# Patient Record
Sex: Male | Born: 1980 | Hispanic: Yes | Marital: Single | Smoking: Former smoker
Health system: Southern US, Community
[De-identification: ages and names within clinical notes are randomized; demographics above are authoritative.]

---

## 2019-11-05 ENCOUNTER — Other Ambulatory Visit: Payer: Self-pay

## 2019-11-07 ENCOUNTER — Emergency Department (HOSPITAL_COMMUNITY): Payer: HRSA Program

## 2019-11-07 ENCOUNTER — Inpatient Hospital Stay (HOSPITAL_COMMUNITY)
Admission: EM | Admit: 2019-11-07 | Discharge: 2019-11-09 | DRG: 177 | Disposition: A | Discharge status: Home / Self Care | Payer: HRSA Program | Attending: Internal Medicine | Admitting: Internal Medicine

## 2019-11-07 ENCOUNTER — Other Ambulatory Visit: Payer: Self-pay

## 2019-11-07 ENCOUNTER — Encounter (HOSPITAL_COMMUNITY): Payer: Self-pay | Admitting: *Deleted

## 2019-11-07 DIAGNOSIS — N179 Acute kidney failure, unspecified: Secondary | ICD-10-CM | POA: Diagnosis present

## 2019-11-07 DIAGNOSIS — J9601 Acute respiratory failure with hypoxia: Secondary | ICD-10-CM | POA: Diagnosis present

## 2019-11-07 DIAGNOSIS — U071 COVID-19: Principal | ICD-10-CM | POA: Diagnosis present

## 2019-11-07 DIAGNOSIS — I10 Essential (primary) hypertension: Secondary | ICD-10-CM | POA: Diagnosis present

## 2019-11-07 DIAGNOSIS — J1282 Pneumonia due to coronavirus disease 2019: Secondary | ICD-10-CM | POA: Diagnosis present

## 2019-11-07 DIAGNOSIS — R739 Hyperglycemia, unspecified: Secondary | ICD-10-CM | POA: Diagnosis present

## 2019-11-07 DIAGNOSIS — R05 Cough: Secondary | ICD-10-CM | POA: Diagnosis present

## 2019-11-07 LAB — CBC WITH DIFFERENTIAL/PLATELET
Abs Immature Granulocytes: 0.01 10*3/uL (ref 0.00–0.07)
Basophils Absolute: 0 10*3/uL (ref 0.0–0.1)
Basophils Relative: 1 %
Eosinophils Absolute: 0 10*3/uL (ref 0.0–0.5)
Eosinophils Relative: 0 %
HCT: 41.7 % (ref 39.0–52.0)
Hemoglobin: 14.2 g/dL (ref 13.0–17.0)
Immature Granulocytes: 0 %
Lymphocytes Relative: 21 %
Lymphs Abs: 0.7 10*3/uL (ref 0.7–4.0)
MCH: 30.5 pg (ref 26.0–34.0)
MCHC: 34.1 g/dL (ref 30.0–36.0)
MCV: 89.7 fL (ref 80.0–100.0)
Monocytes Absolute: 0.2 10*3/uL (ref 0.1–1.0)
Monocytes Relative: 7 %
Neutro Abs: 2.3 10*3/uL (ref 1.7–7.7)
Neutrophils Relative %: 71 %
Platelets: 157 10*3/uL (ref 150–400)
RBC: 4.65 MIL/uL (ref 4.22–5.81)
RDW: 13.1 % (ref 11.5–15.5)
WBC: 3.2 10*3/uL — ABNORMAL LOW (ref 4.0–10.5)
nRBC: 0 % (ref 0.0–0.2)

## 2019-11-07 LAB — FERRITIN: Ferritin: 2921 ng/mL — ABNORMAL HIGH (ref 24–336)

## 2019-11-07 LAB — COMPREHENSIVE METABOLIC PANEL
ALT: 75 U/L — ABNORMAL HIGH (ref 0–44)
AST: 103 U/L — ABNORMAL HIGH (ref 15–41)
Albumin: 3.5 g/dL (ref 3.5–5.0)
Alkaline Phosphatase: 40 U/L (ref 38–126)
Anion gap: 13 (ref 5–15)
BUN: 13 mg/dL (ref 6–20)
CO2: 22 mmol/L (ref 22–32)
Calcium: 9.1 mg/dL (ref 8.9–10.3)
Chloride: 101 mmol/L (ref 98–111)
Creatinine, Ser: 1.47 mg/dL — ABNORMAL HIGH (ref 0.61–1.24)
GFR calc Af Amer: >60 mL/min (ref >60)
GFR calc non Af Amer: 59 mL/min — ABNORMAL LOW (ref >60)
Glucose, Bld: 122 mg/dL — ABNORMAL HIGH (ref 70–99)
Potassium: 3.5 mmol/L (ref 3.5–5.1)
Sodium: 136 mmol/L (ref 135–145)
Total Bilirubin: 0.8 mg/dL (ref 0.3–1.2)
Total Protein: 7.3 g/dL (ref 6.5–8.1)

## 2019-11-07 LAB — C-REACTIVE PROTEIN: CRP: 6.3 mg/dL — ABNORMAL HIGH (ref <1.0)

## 2019-11-07 LAB — TRIGLYCERIDES: Triglycerides: 217 mg/dL — ABNORMAL HIGH (ref <150)

## 2019-11-07 LAB — HIV ANTIBODY (ROUTINE TESTING W REFLEX): HIV Screen 4th Generation wRfx: NONREACTIVE

## 2019-11-07 LAB — FIBRINOGEN: Fibrinogen: 792 mg/dL — ABNORMAL HIGH (ref 210–475)

## 2019-11-07 LAB — TROPONIN I (HIGH SENSITIVITY)
Troponin I (High Sensitivity): 10 ng/L (ref <18)
Troponin I (High Sensitivity): 7 ng/L (ref <18)

## 2019-11-07 LAB — LACTIC ACID, PLASMA
Lactic Acid, Venous: 1.1 mmol/L (ref 0.5–1.9)
Lactic Acid, Venous: 1.1 mmol/L (ref 0.5–1.9)

## 2019-11-07 LAB — SARS CORONAVIRUS 2 BY RT PCR (HOSPITAL ORDER, PERFORMED IN ~~LOC~~ HOSPITAL LAB): SARS Coronavirus 2: POSITIVE — AB

## 2019-11-07 LAB — D-DIMER, QUANTITATIVE: D-Dimer, Quant: 2.01 ug/mL-FEU — ABNORMAL HIGH (ref 0.00–0.50)

## 2019-11-07 LAB — LACTATE DEHYDROGENASE: LDH: 303 U/L — ABNORMAL HIGH (ref 98–192)

## 2019-11-07 LAB — PROCALCITONIN: Procalcitonin: <0.1 ng/mL

## 2019-11-07 MED ORDER — DEXAMETHASONE SODIUM PHOSPHATE 10 MG/ML IJ SOLN
10.0000 mg | INTRAMUSCULAR | Status: DC
  Administered 2019-11-08 – 2019-11-09 (×2): 10 mg via INTRAVENOUS
  Filled 2019-11-07 (×2): qty 1

## 2019-11-07 MED ORDER — ACETAMINOPHEN 325 MG PO TABS
650.0000 mg | ORAL_TABLET | Freq: Once | ORAL | Status: AC | PRN
  Administered 2019-11-07: 650 mg via ORAL
  Filled 2019-11-07: qty 2

## 2019-11-07 MED ORDER — SODIUM CHLORIDE 0.9 % IV SOLN
200.0000 mg | Freq: Once | INTRAVENOUS | Status: AC
  Administered 2019-11-07: 200 mg via INTRAVENOUS
  Filled 2019-11-07: qty 40

## 2019-11-07 MED ORDER — ACETAMINOPHEN 650 MG RE SUPP: 650.0000 mg | Freq: Four times a day (QID) | RECTAL | Status: DC | PRN

## 2019-11-07 MED ORDER — DEXAMETHASONE SODIUM PHOSPHATE 10 MG/ML IJ SOLN
10.0000 mg | Freq: Once | INTRAMUSCULAR | Status: AC
  Administered 2019-11-07: 10 mg via INTRAVENOUS
  Filled 2019-11-07: qty 1

## 2019-11-07 MED ORDER — SODIUM CHLORIDE 0.9 % IV BOLUS
1000.0000 mL | Freq: Once | INTRAVENOUS | Status: AC
  Administered 2019-11-07: 1000 mL via INTRAVENOUS

## 2019-11-07 MED ORDER — SODIUM CHLORIDE 0.9 % IV SOLN
100.0000 mg | Freq: Every day | INTRAVENOUS | Status: DC
  Administered 2019-11-08 – 2019-11-09 (×2): 100 mg via INTRAVENOUS
  Filled 2019-11-07 (×3): qty 20

## 2019-11-07 MED ORDER — HEPARIN SODIUM (PORCINE) 5000 UNIT/ML IJ SOLN
5000.0000 [IU] | Freq: Three times a day (TID) | INTRAMUSCULAR | Status: DC
  Administered 2019-11-07 – 2019-11-08 (×3): 5000 [IU] via SUBCUTANEOUS
  Filled 2019-11-07 (×4): qty 1

## 2019-11-07 MED ORDER — ACETAMINOPHEN 325 MG PO TABS: 650.0000 mg | ORAL_TABLET | Freq: Four times a day (QID) | ORAL | Status: DC | PRN

## 2019-11-07 NOTE — ED Notes (Signed)
Patient is resting comfortably. 

## 2019-11-07 NOTE — Hospital Course (Addendum)
Admitted 11/07/2019  Allergies: Patient has no known allergies. Pertinent Hx: HTN  39 y.o. male p/w worsening SOB, dry cough, and CP  *AHRF 2/2 COVID-19: Weakness, fatigue, sweating x10 days. CP, SOB, and dry cough x 1 day. Diagnosed with COVID a few days ago. CXR and inflammatory markers consistent. On Remdesivir and decadron. On 2L [place].   *AKI: Cr 1.47, unclear BL, given fluids and repeating in AM  *Transaminitis: Unclear baseline, likely 2/2 COVID. Trending after fluids.   Consults: None  Meds: Remdesivir, decadron VTE ppx: Heparin IVF: NS Diet: HH

## 2019-11-07 NOTE — ED Provider Notes (Signed)
[facility] EMERGENCY DEPARTMENT Provider Note   CSN: [id number] Arrival date & time: 11/07/19  0703     History Chief Complaint  Patient presents with  . Cough    Dylan Tucker is a 39 y.o. male.  HPI 39 year old male presents with shortness of breath and low oxygen level.  History is taken with the help of the Spanish interpreter line.  He was diagnosed with Covid a few days ago.  He has had weakness and shortness of breath but everything got worse yesterday evening.  Some cough.  He does not think he is still having fever though he was febrile in triage.  No vaccination versus Covid. Some chest pain.  History reviewed. No pertinent past medical history.  There are no problems to display for this patient.   History reviewed. No pertinent surgical history.     No family history on file.  Social History   Tobacco Use  . Smoking status: Not on file  Substance Use Topics  . Alcohol use: Not on file  . Drug use: Not on file    Home Medications Prior to Admission medications   Not on File    Allergies    Patient has no known allergies.  Review of Systems   Review of Systems  Constitutional: Positive for fever.  Respiratory: Positive for cough and shortness of breath.   Cardiovascular: Positive for chest pain.  Gastrointestinal: Negative for vomiting.  Neurological: Positive for weakness.  All other systems reviewed and are negative.   Physical Exam Updated Vital Signs BP 111/68 (BP Location: Right Arm)   Pulse (!) 101   Temp (!) 103.1 F (39.5 C) (Oral)   Resp (!) 24   SpO2 90%   Physical Exam Vitals and nursing note reviewed.  Constitutional:      Appearance: He is well-developed. He is diaphoretic.  HENT:     Head: Normocephalic and atraumatic.     Right Ear: External ear normal.     Left Ear: External ear normal.     Nose: Nose normal.  Eyes:     General:        Right eye: No discharge.        Left eye: No discharge.    Cardiovascular:     Rate and Rhythm: Regular rhythm. Tachycardia present.     Heart sounds: Normal heart sounds.  Pulmonary:     Effort: Pulmonary effort is normal. No accessory muscle usage.     Breath sounds: Examination of the right-lower field reveals rales. Examination of the left-lower field reveals rales. Rales present.  Abdominal:     Palpations: Abdomen is soft.     Tenderness: There is no abdominal tenderness.  Musculoskeletal:     Cervical back: Neck supple.  Skin:    General: Skin is warm.  Neurological:     Mental Status: He is alert.  Psychiatric:        Mood and Affect: Mood is not anxious.     ED Results / Procedures / Treatments   Labs (all labs ordered are listed, but only abnormal results are displayed) Labs Reviewed  CBC WITH DIFFERENTIAL/PLATELET - Abnormal; Notable for the following components:      Result Value   WBC 3.2 (*)    All other components within normal limits  SARS CORONAVIRUS 2 BY RT PCR (HOSPITAL ORDER, PERFORMED IN [facility])  CULTURE, BLOOD (ROUTINE X 2)  CULTURE, BLOOD (ROUTINE X 2)  LACTIC ACID, PLASMA  LACTIC ACID, PLASMA  COMPREHENSIVE METABOLIC PANEL  D-DIMER, QUANTITATIVE (NOT AT [facility])  PROCALCITONIN  LACTATE DEHYDROGENASE  FERRITIN  FIBRINOGEN  C-REACTIVE PROTEIN  TRIGLYCERIDES  TROPONIN I (HIGH SENSITIVITY)    EKG EKG Interpretation  Date/Time:  Friday November 07 2019 08:58:31 EDT Ventricular Rate:  93 PR Interval:    QRS Duration: 106 QT Interval:  348 QTC Calculation: 433 R Axis:   -31 Text Interpretation: Sinus rhythm Left axis deviation Low voltage, precordial leads Abnormal R-wave progression, early transition Baseline wander in lead(s) V4 No old tracing to compare Confirmed by Pricilla Loveless [phone]) on 11/07/2019 9:16:59 AM   Radiology DG Chest Portable 1 View  Result Date: 11/07/2019 CLINICAL DATA:  COVID positive. EXAM: PORTABLE CHEST 1 VIEW COMPARISON:  None. FINDINGS: 0737 hours.  Markedly low lung volumes. Cardiopericardial silhouette is at upper limits of normal for size. Patchy mid and lower lung multifocal ground-glass airspace disease noted bilaterally. No pleural effusion. No overt edema. The visualized bony structures of the thorax show no acute abnormality. IMPRESSION: Patchy bilateral multifocal ground-glass airspace disease compatible with multifocal pneumonia. Electronically Signed   By: Kennith Center M.D.   On: 11/07/2019 07:45    Procedures Procedures (including critical care time)  Medications Ordered in ED Medications  remdesivir 200 mg in sodium chloride 0.9% 250 mL IVPB (has no administration in time range)    Followed by  remdesivir 100 mg in sodium chloride 0.9 % 100 mL IVPB (has no administration in time range)  acetaminophen (TYLENOL) tablet 650 mg (650 mg Oral Given 11/07/19 0837)  dexamethasone (DECADRON) injection 10 mg (10 mg Intravenous Given 11/07/19 0847)    ED Course  I have reviewed the triage vital signs and the nursing notes.  Pertinent labs & imaging results that were available during my care of the patient were reviewed by me and considered in my medical decision making (see chart for details).    MDM Rules/Calculators/A&P                          Patient became hypoxic to 85% when he stood up to change into a gown only.  Given this, he will need to be admitted with supportive care and other treatments for Covid such as steroids and remdesivir.  He was placed on 2 L oxygen.  His chest x-ray, personally reviewed by myself, shows bilateral pneumonia. Labs show leukopenia and mild LFT abnormalities. Admit.  Dylan Tucker was evaluated in Emergency Department on 11/07/2019 for the symptoms described in the history of present illness. He was evaluated in the context of the global COVID-19 pandemic, which necessitated consideration that the patient might be at risk for infection with the SARS-CoV-2 virus that causes COVID-19. Institutional  protocols and algorithms that pertain to the evaluation of patients at risk for COVID-19 are in a state of rapid change based on information released by regulatory bodies including the CDC and federal and state organizations. These policies and algorithms were followed during the patient's care in the ED.  Final Clinical Impression(s) / ED Diagnoses Final diagnoses:  Acute respiratory failure with hypoxia (HCC)  Pneumonia due to COVID-19 virus    Rx / DC Orders ED Discharge Orders    None       Pricilla Loveless, MD 11/07/19 1009

## 2019-11-07 NOTE — ED Notes (Signed)
Kenney Houseman, niece, [phone] would like an update when available

## 2019-11-07 NOTE — H&P (Signed)
Date: 11/07/2019               Patient Name:  Dylan Tucker MRN: [id number]  DOB: [birth year] Age / Sex: 39 y.o., male   PCP: [facility], [place] Service: Internal Medicine Teaching Service         Attending Physician: Dr. Rebeca Alert, Raynaldo Opitz, MD    First Contact: Dr. Sharon Seller Pager: [phone]            After Hours (After 5p/  First Contact Pager: [phone]  weekends / holidays): Second Contact Pager: [phone]   Chief Complaint: SOB, cough, chest pain, weakness  History of Present Illness: This is a 39 year old [place] speaking male with a history of HTN presenting with worsening SOB, weakness, chills, cough, and chest pain.  He reports that about 10 days ago he started having symptoms, with generalized fatigue, sweating, subjective fevers, and feeling generally unwell.  He was diagnosed with COVID and a few days ago at [facility] clinic. He reports that yesterday he started having a nonproductive cough, shortness of breath, and some chest discomfort when he coughed.  The chest pain is located in the substernal area, was a tight sensation, no associated symptoms including worsening shortness of breath, lightheadedness, dizziness, arm pain, or nausea or vomiting.  He states that he has been eating and drinking with no issues.  Denies any urination issues. Denies abdominal pain, nausea, vomiting, headaches, lightheadedness, dizziness, or fatigue.  Denies any current chest pain.  He reports his wife and 2 children have similar symptoms.  He did not get the vaccine, no specific reason why.  In the ED he is noted to be febrile to 103.1, tachypneic to 24, and tachycardic to 128, blood pressure stable 111/68.  Oxygenating well initially on room air however desaturated to 85% when standing and placed on 2 L nasal cannula.  Labs significant for WBC 3.2 on CBC.  CMP showed creatinine 1.47, unclear baseline, AST 103, ALT 75, T bili and alk phos WNL.  D-dimer 2.01.  Pro-Cal <0.1.   LDH 303.  Ferritin 2.9.  Fibrinogen 792.  CRP 6.3.  Triglycerides 217.  Troponins 10.  And Covid 19+.  Chest x-ray showed patchy bilateral multifocal groundglass opacities, consistent with multifocal pneumonia. Admitted to IMTS for further management.  Meds:  Patient on an antihypertensive medication, does not know the name  Allergies: Allergies as of 11/07/2019  . (No Known Allergies)   History reviewed. No pertinent past medical history.  Family History:  No significant family history.   Social History: Denies smoking, occasional Etoh use 6-7 beers about 8 days. Last drink was awhile. Denies recreational drug use. Works in [employer]. Lives with wife and 2 children. Did not get vaccine.   Review of Systems: A complete ROS was negative except as per HPI.   Physical Exam: Blood pressure 114/75, pulse 85, temperature 99.4 F (37.4 C), temperature source Oral, resp. rate (!) 31, SpO2 90 %. Physical Exam Constitutional:      Appearance: Normal appearance.  HENT:     Head: Normocephalic and atraumatic.     Mouth/Throat:     Mouth: Mucous membranes are moist.     Pharynx: Oropharynx is clear.  Eyes:     Extraocular Movements: Extraocular movements intact.     Pupils: Pupils are equal, round, and reactive to light.  Cardiovascular:     Rate and Rhythm: Normal rate and regular rhythm.  Pulmonary:     Effort: Pulmonary effort is normal. No respiratory distress.     Breath sounds: Rales (Dry crackles in lower lung fields) present. No wheezing or rhonchi.  Abdominal:     General: Abdomen is flat. Bowel sounds are normal.     Palpations: Abdomen is soft.     Tenderness: There is no abdominal tenderness. There is no guarding or rebound.  Musculoskeletal:        General: No swelling. Normal range of motion.     Cervical back: Normal range of motion and neck supple.  Skin:    General: Skin is warm and dry.     Capillary Refill: Capillary refill takes less than 2 seconds.    Neurological:     General: No focal deficit present.     Mental Status: He is alert and oriented to person, place, and time.  Psychiatric:        Mood and Affect: Mood normal.        Behavior: Behavior normal.      EKG: personally reviewed my interpretation is normal sinus rhythm, heart rate 93, no axis deviation, no acute ST changes  CXR: personally reviewed my interpretation is diffuse multiple patchy opacities in bilateral lung fields, concerning for multifocal pneumonia  Assessment & Plan by Problem: Active Problems:   COVID-19  This is a 39 year old male with a history of hypertension who is currently unvaccinated presenting with a 10-day history of generalized weakness, subjective fevers, and sweating, and a 1-2-day history of shortness of breath, chest discomfort, and a nonproductive cough.  Recent diagnosis of Covid 19.  Acute hypoxic respiratory failure secondary to COVID-19 pneumonia Patient presenting with 10-day history of generalized weakness, subjective fevers, and sweating, and a 1-2-day history of shortness of breath, chest discomfort, and a nonproductive cough.  Recent diagnosis of Covid 19.  Noted to be febrile, tachypneic, and tachycardic.  Chest x-ray showed diffuse bilateral patchy opacities concerning for multifocal pneumonia.  Labs significant for AKI, leukopenia, and transaminitis.  Inflammatory markers including D-dimer, LDH, ferritin, fibrinogen, and CRP all elevated.  Consistent with acute hypoxic respiratory failure secondary to Covid pneumonia.  Started on IV Decadron and remdesivir.  -Continue remdesivir and Decadron -Trend inflammatory markers -Continue supplemental oxygen as needed, wean as tolerated  AKI: Creatinine 1.47, unclear baseline.  However if this is elevated from his baseline likely secondary to his current infection, he is tachycardic on exam.  Will repeat kidney function in a.m. -1L [place] -CMP in a.m.  Transaminitis: AST 103, ALT 75, T bili  0.8, alk phos 40.  Unclear baseline.  Unclear if this is related to his current COVID-19 infection.  Will repeat CMP in a.m. -CMP in a.m.  Hypertension: Patient is currently on an antihypertensive at home, he is not sure what it is called.  Blood pressure today is well controlled at 111/68.  We will hold off on resuming his blood pressure medications. -Monitor Bps -Patient will obtain home blood pressure medication name  Dispo: Admit patient to Inpatient with expected length of stay greater than 2 midnights.  Signed: Asencion Noble, MD 11/07/2019, 11:56 AM  Pager: [phone] After 5pm on weekdays and 1pm on weekends: On Call pager: [phone]

## 2019-11-07 NOTE — ED Triage Notes (Signed)
Interpretor [id number] used. Pt states he was tested positive for covid 5 days ago at Clinic on [address] . States he is using a pulse ox at home and notes oxygen to be 'low' at 85%. Pulse ox 91% in triage. Pt is alert and oriented. Appears in nad. Given meds but unsure what they were. Pt is unvaccinated for covid.

## 2019-11-08 ENCOUNTER — Encounter (HOSPITAL_COMMUNITY): Payer: Self-pay | Admitting: Internal Medicine

## 2019-11-08 DIAGNOSIS — J9601 Acute respiratory failure with hypoxia: Secondary | ICD-10-CM

## 2019-11-08 DIAGNOSIS — J1282 Pneumonia due to coronavirus disease 2019: Secondary | ICD-10-CM

## 2019-11-08 DIAGNOSIS — U071 COVID-19: Principal | ICD-10-CM

## 2019-11-08 LAB — LACTATE DEHYDROGENASE: LDH: 245 U/L — ABNORMAL HIGH (ref 98–192)

## 2019-11-08 LAB — CBC
HCT: 40.4 % (ref 39.0–52.0)
Hemoglobin: 13.6 g/dL (ref 13.0–17.0)
MCH: 30.6 pg (ref 26.0–34.0)
MCHC: 33.7 g/dL (ref 30.0–36.0)
MCV: 91 fL (ref 80.0–100.0)
Platelets: 178 10*3/uL (ref 150–400)
RBC: 4.44 MIL/uL (ref 4.22–5.81)
RDW: 13.1 % (ref 11.5–15.5)
WBC: 4.2 10*3/uL (ref 4.0–10.5)
nRBC: 0 % (ref 0.0–0.2)

## 2019-11-08 LAB — COMPREHENSIVE METABOLIC PANEL
ALT: 83 U/L — ABNORMAL HIGH (ref 0–44)
AST: 86 U/L — ABNORMAL HIGH (ref 15–41)
Albumin: 3.1 g/dL — ABNORMAL LOW (ref 3.5–5.0)
Alkaline Phosphatase: 40 U/L (ref 38–126)
Anion gap: 10 (ref 5–15)
BUN: 16 mg/dL (ref 6–20)
CO2: 21 mmol/L — ABNORMAL LOW (ref 22–32)
Calcium: 8.9 mg/dL (ref 8.9–10.3)
Chloride: 106 mmol/L (ref 98–111)
Creatinine, Ser: 0.9 mg/dL (ref 0.61–1.24)
GFR calc Af Amer: >60 mL/min (ref >60)
GFR calc non Af Amer: >60 mL/min (ref >60)
Glucose, Bld: 141 mg/dL — ABNORMAL HIGH (ref 70–99)
Potassium: 3.7 mmol/L (ref 3.5–5.1)
Sodium: 137 mmol/L (ref 135–145)
Total Bilirubin: 0.7 mg/dL (ref 0.3–1.2)
Total Protein: 6.9 g/dL (ref 6.5–8.1)

## 2019-11-08 LAB — C-REACTIVE PROTEIN: CRP: 7.8 mg/dL — ABNORMAL HIGH (ref <1.0)

## 2019-11-08 LAB — FERRITIN: Ferritin: 2412 ng/mL — ABNORMAL HIGH (ref 24–336)

## 2019-11-08 MED ORDER — GUAIFENESIN-DM 100-10 MG/5ML PO SYRP: 5.0000 mL | ORAL_SOLUTION | ORAL | Status: DC | PRN

## 2019-11-08 MED ORDER — ENOXAPARIN SODIUM 40 MG/0.4ML ~~LOC~~ SOLN
40.0000 mg | SUBCUTANEOUS | Status: DC
  Administered 2019-11-08 – 2019-11-09 (×2): 40 mg via SUBCUTANEOUS
  Filled 2019-11-08 (×2): qty 0.4

## 2019-11-08 NOTE — ED Notes (Signed)
pts sats 88 or 89  Nasal 02 increased to 3 liters nasal 02

## 2019-11-08 NOTE — ED Notes (Signed)
Tele  Breakfast Ordered 

## 2019-11-08 NOTE — Progress Notes (Signed)
° °  Subjective:   He is feeling much better today and feels that his breathing has improved. Denies shortness of breath or cough, difficulty with urination, dizziness, or chest pain.   Objective:  Vital signs in last 24 hours: Vitals:   11/08/19 0415 11/08/19 0445 11/08/19 0515 11/08/19 0530  BP: 110/74 109/77 106/75 105/71  Pulse: 78 75 77 73  Resp: (!) 27 (!) 37 17 (!) 33  Temp:      TempSrc:      SpO2: (!) 88% 91% 90% 91%    Supplemental Oxygen: 95% on 3L  Constitution: NAD, appears stated age Cardio: RRR, no m/r/g, no LE edema  Respiratory: decreased breath sounds, no w/r/r Abdominal: NTTP, soft, non-distended MSK: moving all extremities Neuro: normal affect, a&ox3, pleasant  Skin: c/d/i   CXR 11/07/2019 Decreased lung volumes, right atelectasis, bilaterally patchy opacities   Assessment/Plan:  Principal Problem:   Pneumonia due to COVID-19 virus Active Problems:   AKI (acute kidney injury) [facility])  39yo male with PMH HTN recently diagnosed with covid-19 after symptoms started around 10 days ago, presented with worsening respiratory failure and supplemental O2 requirement.   COVID-19 Hospital day 2, admitted yestereday requiring 2L on standing and elevated inflammatory labs, currently requiring about 4L [place], CRP slightly increased today 6.3 > 7.8. He was started on remdesivir and decadron. LFTs trending down, transaminitis secondary to covid-19 infection, improving.   - robitussen q4h prn - remdesivir day 2/5 - decadron day 2 - trend inflammatory labs - IS, flutter valve - will need covid vaccine after discharge  AKI AKI resolved with fluids yesterday.   Hypertension Normotensive  VTE: switch to lovenox IVF: none Diet: regular Code: full   Dispo: Anticipated discharge in approximately 1-2 days.   Franshesca Chipman A, DO 11/08/2019, 7:43 AM Pager: [phone] After 5pm on weekdays and 1pm on weekends: On Call Pager: [phone]

## 2019-11-09 LAB — COMPREHENSIVE METABOLIC PANEL
ALT: 79 U/L — ABNORMAL HIGH (ref 0–44)
AST: 56 U/L — ABNORMAL HIGH (ref 15–41)
Albumin: 3 g/dL — ABNORMAL LOW (ref 3.5–5.0)
Alkaline Phosphatase: 41 U/L (ref 38–126)
Anion gap: 12 (ref 5–15)
BUN: 17 mg/dL (ref 6–20)
CO2: 22 mmol/L (ref 22–32)
Calcium: 9 mg/dL (ref 8.9–10.3)
Chloride: 105 mmol/L (ref 98–111)
Creatinine, Ser: 0.78 mg/dL (ref 0.61–1.24)
GFR calc Af Amer: >60 mL/min (ref >60)
GFR calc non Af Amer: >60 mL/min (ref >60)
Glucose, Bld: 152 mg/dL — ABNORMAL HIGH (ref 70–99)
Potassium: 3.7 mmol/L (ref 3.5–5.1)
Sodium: 139 mmol/L (ref 135–145)
Total Bilirubin: 0.8 mg/dL (ref 0.3–1.2)
Total Protein: 6.7 g/dL (ref 6.5–8.1)

## 2019-11-09 LAB — C-REACTIVE PROTEIN: CRP: 1.8 mg/dL — ABNORMAL HIGH (ref <1.0)

## 2019-11-09 LAB — D-DIMER, QUANTITATIVE: D-Dimer, Quant: 0.72 ug/mL-FEU — ABNORMAL HIGH (ref 0.00–0.50)

## 2019-11-09 LAB — FERRITIN: Ferritin: 2126 ng/mL — ABNORMAL HIGH (ref 24–336)

## 2019-11-09 NOTE — Progress Notes (Addendum)
Patient scheduled for outpatient Remdesivir infusions at 12pm at [facility]. Please inform the patient to park at [address] [place], [place], as staff will be escorting the patient through the east entrance of the hospital. Patients will take approximately 45 minutes.    There is a wave flag banner located near the entrance on [address]. Turn into this entrance and immediately turn left and park in 1 of the 5 designated Covid Infusion Parking spots. There is a phone number on the sign, please call and let the staff know what spot you are in and we will come out and get you. For questions call [phone].  Thanks.

## 2019-11-09 NOTE — Progress Notes (Signed)
   Subjective:   Feeling well today, no shortness of breath, slight cough still. He states he walked around yesterday and this morning and felt fine.   Objective:  Vital signs in last 24 hours: Vitals:   11/08/19 1151 11/08/19 1843 11/09/19 0003 11/09/19 0456  BP:  116/78 99/68   Pulse:  82 75   Resp:  17 18   Temp:   97.9 F (36.6 C)   TempSrc:   Oral   SpO2:  94% 90%   Weight: 104.1 kg   104.3 kg    Supplemental Oxygen: 94% on 3L  Constitution: NAD, appears stated age Cardio: RRR, no m/r/g, no LE edema  Respiratory: CTA, no w/r/r Abdominal: NTTP, soft, non-distended Neuro: normal affect, a&ox3, pleasant  Skin: c/d/i   CXR 11/07/2019 Decreased lung volumes, right atelectasis, bilaterally patchy opacities   Assessment/Plan:  Principal Problem:   Pneumonia due to COVID-19 virus Active Problems:   AKI (acute kidney injury) (HCC)   Acute respiratory failure with hypoxia [facility])  39yo male with PMH HTN recently diagnosed with covid-19 after symptoms started around 10 days ago, presented with worsening respiratory failure and supplemental O2 requirement.   COVID-19 Hospital day 3, still requiring about 3L supplemental oxygen but asymptomatic, will try to wean down. Inflammatory labs slightly improved from yesterday.   - ambulate with oxygen - robitussen q4h prn - remdesivir day 3/5 - decadron day 3 - trend inflammatory labs - IS, flutter valve - will need covid vaccine after discharge  Hypertension Normotensive  VTE: lovenox IVF: none Diet: regular Code: full   Dispo: Anticipated discharge in approximately 1-2 days.   Albena Comes A, DO 11/09/2019, 7:19 AM Pager: [phone] After 5pm on weekdays and 1pm on weekends: On Call Pager: [phone]

## 2019-11-09 NOTE — Progress Notes (Signed)
Patient 92% on room air sitting on side of bed.  Ambulated patient in hall on room air and sats were 90-92%.

## 2019-11-09 NOTE — Plan of Care (Signed)
  Problem: Education: Goal: Knowledge of risk factors and measures for prevention of condition will improve 11/09/2019 1229 by Jill Side, RN Outcome: Adequate for Discharge 11/09/2019 1229 by Jill Side, RN Outcome: Progressing   Problem: Coping: Goal: Psychosocial and spiritual needs will be supported 11/09/2019 1229 by Jill Side, RN Outcome: Adequate for Discharge 11/09/2019 1229 by Jill Side, RN Outcome: Progressing   Problem: Respiratory: Goal: Will maintain a patent airway 11/09/2019 1229 by Jill Side, RN Outcome: Adequate for Discharge 11/09/2019 1229 by Jill Side, RN Outcome: Progressing Goal: Complications related to the disease process, condition or treatment will be avoided or minimized 11/09/2019 1229 by Jill Side, RN Outcome: Adequate for Discharge 11/09/2019 1229 by Jill Side, RN Outcome: Progressing

## 2019-11-09 NOTE — Discharge Instructions (Signed)
Patient scheduled for outpatient Remdesivir infusions at 12pm at [facility]. Please inform the patient to park at [address] [place], [place], as staff will be escorting the patient through the east entrance of the hospital. Patients will take approximately 45 minutes.    There is a wave flag banner located near the entrance on [address]. Turn into this entrance and immediately turn left and park in 1 of the 5 designated Covid Infusion Parking spots. There is a phone number on the sign, please call and let the staff know what spot you are in and we will come out and get you. For questions call [phone].  Thanks.   Fuiste hospitalizado con [id number]. Por favor, quedarse en cuarentena y aislamiento hasta que el [address] [place].   Por favor, va a ver su doctor dentro de dos semanas para su salud y recuperacion.

## 2019-11-09 NOTE — Plan of Care (Signed)
  Problem: Education: Goal: Knowledge of risk factors and measures for prevention of condition will improve Outcome: Progressing   Problem: Coping: Goal: Psychosocial and spiritual needs will be supported Outcome: Progressing   Problem: Respiratory: Goal: Will maintain a patent airway Outcome: Progressing Goal: Complications related to the disease process, condition or treatment will be avoided or minimized Outcome: Progressing   

## 2019-11-09 NOTE — Discharge Summary (Signed)
Name: Dylan Tucker MRN: [id number] DOB: 1980-12-16 39 y.o. PCP: [facility]  Date of Admission: 11/07/2019  7:06 AM Date of Discharge: 11/09/2019 Attending Physician: Inez Catalina, MD  Discharge Diagnosis: 1. Covid-19 Pneumonia 2. Hyperglycemia 3. Acute Kidney Injury  Discharge Medications: Allergies as of 11/09/2019   No Known Allergies     Medication List    TAKE these medications   amoxicillin 500 MG capsule Commonly known as: AMOXIL Take 500 mg by mouth 3 (three) times daily.   lisinopril 20 MG tablet Commonly known as: ZESTRIL Take 20 mg by mouth at bedtime.   lovastatin 10 MG tablet Commonly known as: MEVACOR Take 10 mg by mouth at bedtime.       Disposition and follow-up:   Mr.Dylan Tucker was discharged from [facility] in Stable condition.  At the hospital follow up visit please address:  1. Covid-19 Pneumonia: treated with 3 days decadron and will complete outpatient remdesivir transfusion for 5 days total. No supplemental O2 requirement at discharge 2. Hyperglyemia: In setting of steroids, ~150s. Consider hemoglobin a1c at follow-up 3. Acute kidney injury: pre-renal; resolved with fluids.  2.  Labs / imaging needed at time of follow-up: hemoglobin a1c  3.  Pending labs/ test needing follow-up: none  Follow-up Appointments:  Follow-up Information    [facility] Follow up.   Why: For remdesivir Infusion at 12:00 tomorrow.  Please inform the patient to park at [address], call [phone] when you arrive Contact information: [address] [place] [address] [phone]              Hospital Course by problem list: 1. Covid-19 Pneumonia 3. Acute kidney injury Dylan Tucker is a 40yo male with history of hypertension who presented with 10 days of shortness of breath and fatigue, found to be positive for covid-19. He was also found to have acute kidney injury  secondary to dehydration. Cxr showed bilateral opacities, and patient had supplemental oxygen requirement of 2L. He was treated with three days of decadron and remdesivir. Symptoms resolved on day 2 and aki resolved with 1L fluid bolus. On day 3 he was ambulated prior to discharge and remained asymptomatic with oxygen saturations at 90% and above on room air. He will complete last two remdesivir doses at the outpatient infusion center.    Discharge Vitals:   BP 105/72 (BP Location: Left Arm)   Pulse 73   Temp 97.9 F (36.6 C) (Oral)   Resp 16   Wt 104.3 kg   SpO2 94%   Pertinent Labs, Studies, and Procedures:   Recent Labs    11/07/19 0830 11/08/19 0327 11/09/19 0500  GLUCOSE 122* 141* 152*   CXR 9/3 IMPRESSION: Patchy bilateral multifocal ground-glass airspace disease compatible with multifocal pneumonia.   Electronically Signed   By: Kennith Center M.D.   On: 11/07/2019 07:45  Discharge Instructions: Discharge Instructions    Call MD for:  difficulty breathing, headache or visual disturbances   Complete by: As directed    Call MD for:  extreme fatigue   Complete by: As directed    Call MD for:  persistant dizziness or light-headedness   Complete by: As directed    Call MD for:  persistant nausea and vomiting   Complete by: As directed    Call MD for:  temperature >100.4   Complete by: As directed    Diet - low sodium heart healthy   Complete  by: As directed    Increase activity slowly   Complete by: As directed       Signed: Guinevere Scarlet A, DO 11/09/2019, 12:08 PM   Pager: [phone]

## 2019-11-10 ENCOUNTER — Ambulatory Visit (HOSPITAL_COMMUNITY)
Admit: 2019-11-10 | Discharge: 2019-11-10 | Disposition: A | Discharge status: Home / Self Care | Payer: HRSA Program | Attending: Pulmonary Disease | Admitting: Pulmonary Disease

## 2019-11-10 DIAGNOSIS — J1282 Pneumonia due to coronavirus disease 2019: Secondary | ICD-10-CM | POA: Diagnosis not present

## 2019-11-10 DIAGNOSIS — U071 COVID-19: Secondary | ICD-10-CM | POA: Diagnosis present

## 2019-11-10 MED ORDER — SODIUM CHLORIDE 0.9 % IV SOLN
100.0000 mg | Freq: Once | INTRAVENOUS | Status: AC
  Administered 2019-11-10: 100 mg via INTRAVENOUS
  Filled 2019-11-10: qty 20

## 2019-11-10 MED ORDER — FAMOTIDINE IN NACL 20-0.9 MG/50ML-% IV SOLN: 20.0000 mg | Freq: Once | INTRAVENOUS | Status: DC | PRN

## 2019-11-10 MED ORDER — SODIUM CHLORIDE 0.9 % IV SOLN: INTRAVENOUS | Status: DC | PRN

## 2019-11-10 MED ORDER — DIPHENHYDRAMINE HCL 50 MG/ML IJ SOLN: 50.0000 mg | Freq: Once | INTRAMUSCULAR | Status: DC | PRN

## 2019-11-10 MED ORDER — METHYLPREDNISOLONE SODIUM SUCC 125 MG IJ SOLR: 125.0000 mg | Freq: Once | INTRAMUSCULAR | Status: DC | PRN

## 2019-11-10 MED ORDER — ALBUTEROL SULFATE HFA 108 (90 BASE) MCG/ACT IN AERS: 2.0000 | INHALATION_SPRAY | Freq: Once | RESPIRATORY_TRACT | Status: DC | PRN

## 2019-11-10 MED ORDER — EPINEPHRINE 0.3 MG/0.3ML IJ SOAJ: 0.3000 mg | Freq: Once | INTRAMUSCULAR | Status: DC | PRN

## 2019-11-10 NOTE — Progress Notes (Signed)
  Diagnosis: COVID-19  Physician: Dr. Wright  Procedure: [facility] Med: remdesivir infusion - Provided patient with remdesivir fact sheet for patients, parents and caregivers prior to infusion.  Complications: No immediate complications noted.  Discharge: Discharged home   Sheilia Reznick R Nandana Krolikowski 11/10/2019   

## 2019-11-10 NOTE — Discharge Instructions (Signed)
10 Things You Can Do to Manage Your COVID-19 Symptoms at Home If you have possible or confirmed COVID-19: 1. Stay home from work and school. And stay away from other public places. If you must go out, avoid using any kind of public transportation, ridesharing, or taxis. 2. Monitor your symptoms carefully. If your symptoms get worse, call your healthcare provider immediately. 3. Get rest and stay hydrated. 4. If you have a medical appointment, call the healthcare provider ahead of time and tell them that you have or may have COVID-19. 5. For medical emergencies, call 911 and notify the dispatch personnel that you have or may have COVID-19. 6. Cover your cough and sneezes with a tissue or use the inside of your elbow. 7. Wash your hands often with soap and water for at least 20 seconds or clean your hands with an alcohol-based hand sanitizer that contains at least 60% alcohol. 8. As much as possible, stay in a specific room and away from other people in your home. Also, you should use a separate bathroom, if available. If you need to be around other people in or outside of the home, wear a mask. 9. Avoid sharing personal items with other people in your household, like dishes, towels, and bedding. 10. Clean all surfaces that are touched often, like counters, tabletops, and doorknobs. Use household cleaning sprays or wipes according to the label instructions. cdc.gov/coronavirus 09/04/2018 This information is not intended to replace advice given to you by your health care provider. Make sure you discuss any questions you have with your health care provider. Document Revised: 02/06/2019 Document Reviewed: 02/06/2019 Elsevier Patient Education  2020 [employer]  

## 2019-11-11 ENCOUNTER — Ambulatory Visit (HOSPITAL_COMMUNITY)
Admit: 2019-11-11 | Discharge: 2019-11-11 | Disposition: A | Discharge status: Home / Self Care | Payer: HRSA Program | Attending: Pulmonary Disease | Admitting: Pulmonary Disease

## 2019-11-11 DIAGNOSIS — J1282 Pneumonia due to coronavirus disease 2019: Secondary | ICD-10-CM | POA: Insufficient documentation

## 2019-11-11 DIAGNOSIS — U071 COVID-19: Secondary | ICD-10-CM | POA: Insufficient documentation

## 2019-11-11 MED ORDER — SODIUM CHLORIDE 0.9 % IV SOLN
100.0000 mg | Freq: Once | INTRAVENOUS | Status: AC
  Administered 2019-11-11: 100 mg via INTRAVENOUS
  Filled 2019-11-11: qty 20

## 2019-11-11 MED ORDER — FAMOTIDINE IN NACL 20-0.9 MG/50ML-% IV SOLN: 20.0000 mg | Freq: Once | INTRAVENOUS | Status: DC | PRN

## 2019-11-11 MED ORDER — METHYLPREDNISOLONE SODIUM SUCC 125 MG IJ SOLR: 125.0000 mg | Freq: Once | INTRAMUSCULAR | Status: DC | PRN

## 2019-11-11 MED ORDER — DIPHENHYDRAMINE HCL 50 MG/ML IJ SOLN: 50.0000 mg | Freq: Once | INTRAMUSCULAR | Status: DC | PRN

## 2019-11-11 MED ORDER — EPINEPHRINE 0.3 MG/0.3ML IJ SOAJ: 0.3000 mg | Freq: Once | INTRAMUSCULAR | Status: DC | PRN

## 2019-11-11 MED ORDER — ALBUTEROL SULFATE HFA 108 (90 BASE) MCG/ACT IN AERS: 2.0000 | INHALATION_SPRAY | Freq: Once | RESPIRATORY_TRACT | Status: DC | PRN

## 2019-11-11 MED ORDER — SODIUM CHLORIDE 0.9 % IV SOLN: INTRAVENOUS | Status: DC | PRN

## 2019-11-11 NOTE — Progress Notes (Addendum)
  Diagnosis: COVID-19  Physician:Dr. Wright  Procedure: [facility] Med: remdesivir infusion - Provided patient with remdesivir fact sheet for patients, parents and caregivers prior to infusion.  Complications: No immediate complications noted.  Discharge: Discharged home   Dylan Tucker 11/11/2019  

## 2019-11-11 NOTE — Discharge Instructions (Signed)
10 Things You Can Do to Manage Your COVID-19 Symptoms at Home If you have possible or confirmed COVID-19: 1. Stay home from work and school. And stay away from other public places. If you must go out, avoid using any kind of public transportation, ridesharing, or taxis. 2. Monitor your symptoms carefully. If your symptoms get worse, call your healthcare provider immediately. 3. Get rest and stay hydrated. 4. If you have a medical appointment, call the healthcare provider ahead of time and tell them that you have or may have COVID-19. 5. For medical emergencies, call 911 and notify the dispatch personnel that you have or may have COVID-19. 6. Cover your cough and sneezes with a tissue or use the inside of your elbow. 7. Wash your hands often with soap and water for at least 20 seconds or clean your hands with an alcohol-based hand sanitizer that contains at least 60% alcohol. 8. As much as possible, stay in a specific room and away from other people in your home. Also, you should use a separate bathroom, if available. If you need to be around other people in or outside of the home, wear a mask. 9. Avoid sharing personal items with other people in your household, like dishes, towels, and bedding. 10. Clean all surfaces that are touched often, like counters, tabletops, and doorknobs. Use household cleaning sprays or wipes according to the label instructions. cdc.gov/coronavirus 09/04/2018 This information is not intended to replace advice given to you by your health care provider. Make sure you discuss any questions you have with your health care provider. Document Revised: 02/06/2019 Document Reviewed: 02/06/2019 Elsevier Patient Education  2020 [employer]  

## 2019-11-12 LAB — CULTURE, BLOOD (ROUTINE X 2)
Culture: NO GROWTH
Culture: NO GROWTH
Special Requests: ADEQUATE

## 2021-12-17 IMAGING — DX DG CHEST 1V PORT
1 series · 1 of 1 positions shown · non-contrast
Comparison: None.

CLINICAL DATA: COVID positive.

EXAM:
PORTABLE CHEST 1 VIEW

[chest]
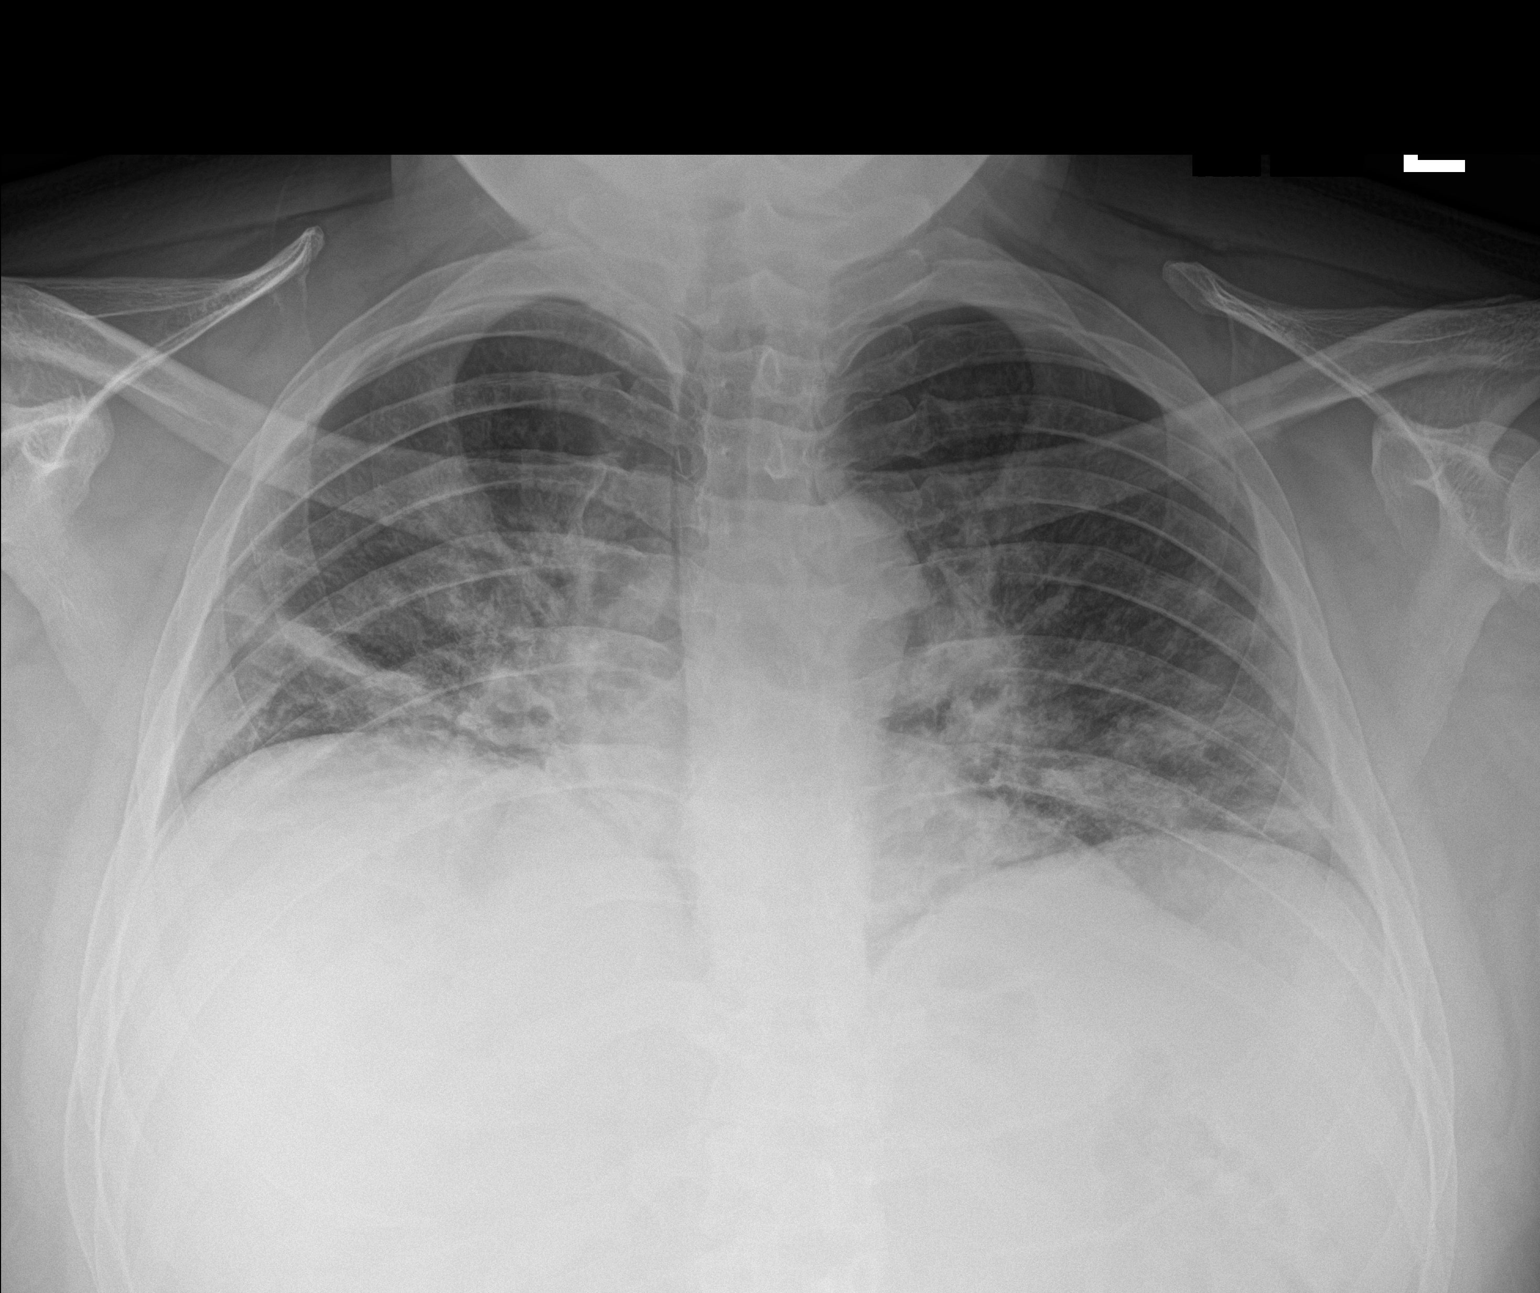

[1 of 1 positions shown; findings below may reference images not displayed]

FINDINGS: 0252 hours. Markedly low lung volumes. Cardiopericardial silhouette
is at upper limits of normal for size. Patchy mid and lower lung
multifocal ground-glass airspace disease noted bilaterally. No
pleural effusion. No overt edema. The visualized bony structures of
the thorax show no acute abnormality.
IMPRESSION: Patchy bilateral multifocal ground-glass airspace disease compatible
with multifocal pneumonia.
